# Patient Record
Sex: Female | Born: 1975 | Race: Black or African American | Hispanic: No | Marital: Married | Smoking: Never smoker
Health system: Southern US, Community
[De-identification: ages and names within clinical notes are randomized; demographics above are authoritative.]

## PROBLEM LIST (undated history)

## (undated) DIAGNOSIS — J45909 Unspecified asthma, uncomplicated: Secondary | ICD-10-CM

## (undated) HISTORY — PX: TUBAL LIGATION: SHX77

---

## 2016-02-25 ENCOUNTER — Emergency Department
Admission: EM | Admit: 2016-02-25 | Discharge: 2016-02-25 | Disposition: A | Discharge status: Home / Self Care | Payer: Managed Care, Other (non HMO) | Attending: Student in an Organized Health Care Education/Training Program | Admitting: Student in an Organized Health Care Education/Training Program

## 2016-02-25 ENCOUNTER — Emergency Department: Payer: Managed Care, Other (non HMO)

## 2016-02-25 DIAGNOSIS — Y929 Unspecified place or not applicable: Secondary | ICD-10-CM | POA: Insufficient documentation

## 2016-02-25 DIAGNOSIS — Y998 Other external cause status: Secondary | ICD-10-CM | POA: Diagnosis not present

## 2016-02-25 DIAGNOSIS — S99911A Unspecified injury of right ankle, initial encounter: Secondary | ICD-10-CM | POA: Diagnosis present

## 2016-02-25 DIAGNOSIS — Y9351 Activity, roller skating (inline) and skateboarding: Secondary | ICD-10-CM | POA: Diagnosis not present

## 2016-02-25 DIAGNOSIS — S82851A Displaced trimalleolar fracture of right lower leg, initial encounter for closed fracture: Secondary | ICD-10-CM

## 2016-02-25 DIAGNOSIS — J45909 Unspecified asthma, uncomplicated: Secondary | ICD-10-CM | POA: Diagnosis not present

## 2016-02-25 MED ORDER — PROPOFOL 1000 MG/100ML IV EMUL
INTRAVENOUS | Status: AC
  Administered 2016-02-25: 60 mg via INTRAVENOUS
  Filled 2016-02-25: qty 100

## 2016-02-25 MED ORDER — POLYETHYLENE GLYCOL 3350 17 G PO PACK: 17.0000 g | PACK | Freq: Every day | ORAL | 0 refills | Status: AC

## 2016-02-25 MED ORDER — KETAMINE HCL 10 MG/ML IJ SOLN
0.2000 mg/kg | Freq: Once | INTRAMUSCULAR | Status: AC
  Administered 2016-02-25: 25 mg via INTRAVENOUS
  Filled 2016-02-25: qty 1

## 2016-02-25 MED ORDER — LORAZEPAM 2 MG/ML IJ SOLN
INTRAMUSCULAR | Status: AC
  Filled 2016-02-25: qty 1

## 2016-02-25 MED ORDER — PROPOFOL 10 MG/ML IV BOLUS
0.5000 mg/kg | Freq: Once | INTRAVENOUS | Status: AC
  Administered 2016-02-25: 60 mg via INTRAVENOUS

## 2016-02-25 MED ORDER — KETAMINE HCL 10 MG/ML IJ SOLN
INTRAMUSCULAR | Status: AC | PRN
  Administered 2016-02-25: 40 mg via INTRAVENOUS

## 2016-02-25 MED ORDER — KETAMINE HCL 10 MG/ML IJ SOLN
1.0000 mg/kg | Freq: Once | INTRAMUSCULAR | Status: AC
  Administered 2016-02-25: 123 mg via INTRAVENOUS

## 2016-02-25 MED ORDER — FENTANYL CITRATE (PF) 100 MCG/2ML IJ SOLN
100.0000 ug | Freq: Once | INTRAMUSCULAR | Status: AC
  Administered 2016-02-25: 100 ug via INTRAVENOUS
  Filled 2016-02-25: qty 2

## 2016-02-25 MED ORDER — LORAZEPAM 2 MG/ML IJ SOLN
INTRAMUSCULAR | Status: AC | PRN
  Administered 2016-02-25: 0.5 mg via INTRAVENOUS

## 2016-02-25 MED ORDER — PROPOFOL 10 MG/ML IV BOLUS
INTRAVENOUS | Status: AC | PRN
  Administered 2016-02-25: 50 mg via INTRAVENOUS
  Administered 2016-02-25: 60 mg via INTRAVENOUS

## 2016-02-25 MED ORDER — HYDROCODONE-ACETAMINOPHEN 5-325 MG PO TABS: 1.0000 | ORAL_TABLET | ORAL | 0 refills | Status: AC | PRN

## 2016-02-25 MED ORDER — ASPIRIN EC 325 MG PO TBEC: 325.0000 mg | DELAYED_RELEASE_TABLET | Freq: Every day | ORAL | 0 refills | Status: AC

## 2016-02-25 NOTE — ED Triage Notes (Signed)
Pt came to ED via EMS c/o right ankle pain after fall while rollerskating. Given fentanyl by EMS.

## 2016-02-25 NOTE — ED Provider Notes (Signed)
[facility] Emergency Department Provider Note    First MD Initiated Contact with Patient 02/25/16 1553     (approximate)  I have reviewed the triage vital signs and the nursing notes.   HISTORY  Chief Complaint Ankle Pain    HPI Joanne Davis is a 40 y.o. female  who presents with acute right ankle pain after fall while roller skating. Patient with obvious deformity to right ankle. Denies any head injury. No chest, abdominal or pelvic pain. Denies any knee or hip pain. Is not on any blood thinners. EMS brought patient in the ER due to obvious deformity after splinting it in place. She does still have distal citrate sensation to her toes.  Currently rates the pain as 10 out of 10 in severity.   History reviewed. No pertinent past medical history. Family H/o no bleeding disorders Past Surgical History:  Procedure Laterality Date  . TUBAL LIGATION     There are no active problems to display for this patient.     Prior to Admission medications   Medication Sig Start Date End Date Taking? Authorizing Provider  aspirin EC 325 MG tablet Take 1 tablet (325 mg total) by mouth daily. 02/25/16 03/10/16  Willy Eddy, MD  HYDROcodone-acetaminophen (NORCO) 5-325 MG tablet Take 1 tablet by mouth every 4 (four) hours as needed for moderate pain. 02/25/16   Willy Eddy, MD  polyethylene glycol [facility] / Ethelene Hal) packet Take 17 g by mouth daily. Mix one tablespoon with 8oz of your favorite juice or water every day until you are having soft formed stools. Then start taking once daily if you didn't have a stool the day before. 02/25/16   Willy Eddy, MD    Allergies Patient has no known allergies.    Social History Social History  Substance Use Topics  . Smoking status: Never Smoker  . Smokeless tobacco: Never Used  . Alcohol use Yes     Comment: ocassional     Review of Systems Patient denies headaches, rhinorrhea, blurry vision, numbness,  shortness of breath, chest pain, edema, cough, abdominal pain, nausea, vomiting, diarrhea, dysuria, fevers, rashes or hallucinations unless otherwise stated above in HPI. ____________________________________________   PHYSICAL EXAM:  VITAL SIGNS: Vitals:   02/25/16 1830 02/25/16 1900  BP: 119/63   Pulse: 82 92  Resp: 16 18  Temp:     Constitutional: Alert and oriented.uncomfortable appearing but in no acute distress. Eyes: Conjunctivae are normal. PERRL. EOMI. Head: Atraumatic. Nose: No congestion/rhinnorhea. Mouth/Throat: Mucous membranes are moist.  Oropharynx non-erythematous. Neck: No stridor. Painless ROM. No cervical spine tenderness to palpation Hematological/Lymphatic/Immunilogical: No cervical lymphadenopathy. Cardiovascular: Normal rate, regular rhythm. Grossly normal heart sounds.  Good peripheral circulation. Respiratory: Normal respiratory effort.  No retractions. Lungs CTAB. Gastrointestinal: Soft and nontender. No distention. No abdominal bruits. No CVA tenderness.  Musculoskeletal:  Obvious varus deformity to right ankle,  SILT distally.  2+ DP and PT pulses.  No lacerations Neurologic:  Normal speech and language. No gross focal neurologic deficits are appreciated. No gait instability. Skin:  Skin is warm, dry and intact. No rash noted. Psychiatric: Mood and affect are normal. Speech and behavior are normal.  ____________________________________________   LABS (all labs ordered are listed, but only abnormal results are displayed)  No results found for this or any previous visit (from the past 24 hour(s)). ____________________________________________  EKG____________________________________________  RADIOLOGY  I personally reviewed all radiographic images ordered to evaluate for the above acute complaints and reviewed radiology reports and  findings.  These findings were personally discussed with the patient.  Please see medical record for radiology  report.  ____________________________________________   PROCEDURES  Procedure(s) performed:  ORTHOPEDIC INJURY TREATMENT Date/Time: 02/25/2016 7:36 PM Performed by: Willy EddyOBINSON, Phillippa Straub Authorized by: Willy EddyOBINSON, Mahreen Schewe  Consent: Verbal consent obtained. Risks and benefits: risks, benefits and alternatives were discussed Consent given by: patient Injury location: ankle Location details: right ankle Injury type: fracture-dislocation Fracture type: trimalleolar Pre-procedure neurovascular assessment: neurovascularly intact Pre-procedure distal perfusion: normal Pre-procedure neurological function: normal Pre-procedure range of motion: reduced  Sedation: Patient sedated: yes Sedation type: moderate (conscious) sedation Sedatives: ketamine, propofol and see MAR for details Analgesia: fentanyl, ketamine and see MAR for details Vitals: Vital signs were monitored during sedation. Manipulation performed: yes Skin traction used: yes Reduction successful: yes X-ray confirmed reduction: yes Immobilization: splint Splint type: short leg Supplies used: Ortho-Glass Post-procedure neurovascular assessment: post-procedure neurovascularly intact Post-procedure distal perfusion: normal Post-procedure neurological function: normal Post-procedure range of motion: unchanged Patient tolerance: Patient tolerated the procedure well with no immediate complications       Critical Care performed: no ____________________________________________   INITIAL IMPRESSION / ASSESSMENT AND PLAN / ED COURSE  Pertinent labs & imaging results that were available during my care of the patient were reviewed by me and considered in my medical decision making (see chart for details).  DDX: fracture, dislocation, contusion, sprain  Joanne Davis is a 40 y.o.  female with acute right ankle injury. Patient is AFVSS in ED. Exam as above. Given current presentation have considered the above  differential. Denies any other injuries. Denies motor or sensory loss. Obvious deformity concerning for fracture with dislocation.. Afebrile and VSS in Ed. Exam as above. NV intact throughout and distal to injury. Treatments will include analgesia, observation, X-rays.   Clinical Course as of Feb 25 1936  Sat Feb 25, 2016  1638 Patient with no lateral knee pain. X-ray shows evidence of a trimalleolar fracture of the right ankle. Plan sedation with reduction.  [PR]  1748 Ankle reduction performed under procedural sedation with resolved deformity.  Post reduction xr ordered.  [PR]  1823 Dr. Rosita KeaMenz of orthopedics has reviewed the x-ray imaging and does state that the reduction is appropriate for close outpatient follow-up. He agrees to see patient in clinic on Tuesday at 9 AM.  [PR]  1935 Patient now stable after her sedation. Discussed the importance of elevation as well as aspirin for DVT prophylaxis. Discussed signs and symptoms for which patient should return to the ER. Patient is able to ambulate with crutches.    [PR]    Clinical Course User Index [PR] Willy EddyPatrick Sadat Sliwa, MD     ____________________________________________   FINAL CLINICAL IMPRESSION(S) / ED DIAGNOSES  Final diagnoses:  Closed trimalleolar fracture of right ankle, initial encounter      NEW MEDICATIONS STARTED DURING THIS VISIT:  New Prescriptions   ASPIRIN EC 325 MG TABLET    Take 1 tablet (325 mg total) by mouth daily.   HYDROCODONE-ACETAMINOPHEN (NORCO) 5-325 MG TABLET    Take 1 tablet by mouth every 4 (four) hours as needed for moderate pain.   POLYETHYLENE GLYCOL (MIRALAX / GLYCOLAX) PACKET    Take 17 g by mouth daily. Mix one tablespoon with 8oz of your favorite juice or water every day until you are having soft formed stools. Then start taking once daily if you didn't have a stool the day before.     Note:  This document was prepared using Conservation officer, historic buildingsDragon voice recognition software  and may include unintentional  dictation errors.    Willy EddyPatrick Mervyn Pflaum, MD 02/25/16 [id number]

## 2016-02-25 NOTE — ED Notes (Signed)
Pt assisted up to restroom. Pt fitted with walker.

## 2016-02-25 NOTE — Discharge Instructions (Signed)
Keep splint on until seen by your physician or the orthopedic physician As directed. Elevate for swelling and pain. Keep splint material dry. Take medications as needed for pain. Return for severe intractable pain or other concerns. ° °

## 2016-02-29 ENCOUNTER — Other Ambulatory Visit: Payer: Managed Care, Other (non HMO)

## 2016-02-29 ENCOUNTER — Encounter
Admission: RE | Admit: 2016-02-29 | Discharge: 2016-02-29 | Disposition: A | Discharge status: Home / Self Care | Payer: Managed Care, Other (non HMO) | Source: Ambulatory Visit | Attending: Orthopedic Surgery | Admitting: Orthopedic Surgery

## 2016-02-29 HISTORY — DX: Unspecified asthma, uncomplicated: J45.909

## 2016-02-29 NOTE — Patient Instructions (Signed)
Your procedure is scheduled on: 03/01/16 Report to DAY SURGERY. 2ND FLOOR MEDICAL MALL ENTRANCE. To find out your arrival time please call [phone] between 1PM - 3PM on 02/29/16.  Remember: Instructions that are not followed completely may result in serious medical risk, up to and including death, or upon the discretion of your surgeon and anesthesiologist your surgery may need to be rescheduled.    __X__ 1. Do not eat food or drink liquids after midnight. No gum chewing or hard candies.     __X__ 2. No Alcohol for 24 hours before or after surgery.   ____ 3. Bring all medications with you on the day of surgery if instructed.    __X__ 4. Notify your doctor if there is any change in your medical condition     (cold, fever, infections).             ___x__5. No smoking within 24 hours of your surgery.     Do not wear jewelry, make-up, hairpins, clips or nail polish.  Do not wear lotions, powders, or perfumes.   Do not shave 48 hours prior to surgery. Men may shave face and neck.  Do not bring valuables to the hospital.    [facility] any belongings or valuables.               Contacts, dentures or bridgework may not be worn into surgery.  Leave your suitcase in the car. After surgery it may be brought to your room.  For patients admitted to the hospital, discharge time is determined by your                treatment team.   Patients discharged the day of surgery will not be allowed to drive home.   Please read over the following fact sheets that you were given:    ____ Take these medicines the morning of surgery with A SIP OF WATER:    1. Hydrocodone if needed  2.   3.   4.  5.  6.  ____ Fleet Enema (as directed)   ____ Use CHG Soap as directed  ____ Use inhalers on the day of surgery  ____ Stop metformin 2 days prior to surgery    ____ Take 1/2 of usual insulin dose the night before surgery and none on the morning of surgery.   __x__ Stop  Coumadin/Plavix/aspirin on Now  __X__ Stop Anti-inflammatories such as Advil, Aleve, Ibuprofen, Motrin, Naproxen, Naprosyn, Goodies,powder, or aspirin products.  OK to take Tylenol.   ____ Stop supplements until after surgery.    ____ Bring C-Pap to the hospital.

## 2016-03-01 ENCOUNTER — Ambulatory Visit
Admission: RE | Admit: 2016-03-01 | Discharge: 2016-03-01 | Disposition: A | Discharge status: Home / Self Care | Payer: Managed Care, Other (non HMO) | Source: Ambulatory Visit | Attending: Orthopedic Surgery | Admitting: Orthopedic Surgery

## 2016-03-01 ENCOUNTER — Ambulatory Visit: Payer: Managed Care, Other (non HMO)

## 2016-03-01 ENCOUNTER — Ambulatory Visit: Payer: Managed Care, Other (non HMO) | Admitting: Anesthesiology

## 2016-03-01 ENCOUNTER — Encounter: Payer: Self-pay | Admitting: *Deleted

## 2016-03-01 ENCOUNTER — Encounter
Admission: RE | Disposition: A | Discharge status: Home / Self Care | Payer: Self-pay | Source: Ambulatory Visit | Attending: Orthopedic Surgery

## 2016-03-01 DIAGNOSIS — Z6841 Body Mass Index (BMI) 40.0 and over, adult: Secondary | ICD-10-CM | POA: Diagnosis not present

## 2016-03-01 DIAGNOSIS — Y998 Other external cause status: Secondary | ICD-10-CM | POA: Diagnosis not present

## 2016-03-01 DIAGNOSIS — J45909 Unspecified asthma, uncomplicated: Secondary | ICD-10-CM | POA: Insufficient documentation

## 2016-03-01 DIAGNOSIS — Z419 Encounter for procedure for purposes other than remedying health state, unspecified: Secondary | ICD-10-CM

## 2016-03-01 DIAGNOSIS — W010XXA Fall on same level from slipping, tripping and stumbling without subsequent striking against object, initial encounter: Secondary | ICD-10-CM | POA: Diagnosis not present

## 2016-03-01 DIAGNOSIS — Y92331 Roller skating rink as the place of occurrence of the external cause: Secondary | ICD-10-CM | POA: Insufficient documentation

## 2016-03-01 DIAGNOSIS — Y9351 Activity, roller skating (inline) and skateboarding: Secondary | ICD-10-CM | POA: Insufficient documentation

## 2016-03-01 DIAGNOSIS — S82851A Displaced trimalleolar fracture of right lower leg, initial encounter for closed fracture: Secondary | ICD-10-CM | POA: Diagnosis not present

## 2016-03-01 DIAGNOSIS — Z8781 Personal history of (healed) traumatic fracture: Secondary | ICD-10-CM

## 2016-03-01 DIAGNOSIS — Z9889 Other specified postprocedural states: Secondary | ICD-10-CM

## 2016-03-01 HISTORY — PX: ORIF ANKLE FRACTURE: SHX5408

## 2016-03-01 LAB — BASIC METABOLIC PANEL
ANION GAP: 6 (ref 5–15)
BUN: 9 mg/dL (ref 6–20)
CALCIUM: 8.5 mg/dL — AB (ref 8.9–10.3)
CO2: 24 mmol/L (ref 22–32)
CREATININE: 0.79 mg/dL (ref 0.44–1.00)
Chloride: 108 mmol/L (ref 101–111)
GFR calc Af Amer: >60 mL/min (ref >60)
GLUCOSE: 89 mg/dL (ref 65–99)
Potassium: 4 mmol/L (ref 3.5–5.1)
Sodium: 138 mmol/L (ref 135–145)

## 2016-03-01 SURGERY — OPEN REDUCTION INTERNAL FIXATION (ORIF) ANKLE FRACTURE
Anesthesia: General | Site: Ankle | Laterality: Right | Wound class: Clean

## 2016-03-01 MED ORDER — ONDANSETRON HCL 4 MG PO TABS: 4.0000 mg | ORAL_TABLET | Freq: Four times a day (QID) | ORAL | Status: DC | PRN

## 2016-03-01 MED ORDER — ROCURONIUM BROMIDE 50 MG/5ML IV SOSY
PREFILLED_SYRINGE | INTRAVENOUS | Status: AC
  Filled 2016-03-01: qty 5

## 2016-03-01 MED ORDER — CEFAZOLIN SODIUM 1 G IJ SOLR
INTRAMUSCULAR | Status: AC
  Filled 2016-03-01: qty 10

## 2016-03-01 MED ORDER — KETOROLAC TROMETHAMINE 30 MG/ML IJ SOLN
INTRAMUSCULAR | Status: AC
  Filled 2016-03-01: qty 1

## 2016-03-01 MED ORDER — HYDROCODONE-ACETAMINOPHEN 5-325 MG PO TABS
1.0000 | ORAL_TABLET | ORAL | Status: DC | PRN
  Administered 2016-03-01: 1 via ORAL

## 2016-03-01 MED ORDER — METOCLOPRAMIDE HCL 10 MG PO TABS: 5.0000 mg | ORAL_TABLET | Freq: Three times a day (TID) | ORAL | Status: DC | PRN

## 2016-03-01 MED ORDER — METHOCARBAMOL 500 MG PO TABS: 500.0000 mg | ORAL_TABLET | Freq: Four times a day (QID) | ORAL | Status: DC | PRN

## 2016-03-01 MED ORDER — MIDAZOLAM HCL 2 MG/2ML IJ SOLN
INTRAMUSCULAR | Status: AC
  Filled 2016-03-01: qty 2

## 2016-03-01 MED ORDER — HYDROMORPHONE HCL 1 MG/ML IJ SOLN
INTRAMUSCULAR | Status: AC
  Filled 2016-03-01: qty 1

## 2016-03-01 MED ORDER — ACETAMINOPHEN 10 MG/ML IV SOLN
INTRAVENOUS | Status: AC
  Filled 2016-03-01: qty 100

## 2016-03-01 MED ORDER — ONDANSETRON HCL 4 MG/2ML IJ SOLN
4.0000 mg | Freq: Four times a day (QID) | INTRAMUSCULAR | Status: DC | PRN
  Administered 2016-03-01: 4 mg via INTRAVENOUS

## 2016-03-01 MED ORDER — DOCUSATE SODIUM 100 MG PO CAPS: 100.0000 mg | ORAL_CAPSULE | Freq: Two times a day (BID) | ORAL | Status: DC

## 2016-03-01 MED ORDER — FENTANYL CITRATE (PF) 250 MCG/5ML IJ SOLN
INTRAMUSCULAR | Status: AC
  Filled 2016-03-01: qty 5

## 2016-03-01 MED ORDER — CEFAZOLIN SODIUM-DEXTROSE 2-4 GM/100ML-% IV SOLN: 2.0000 g | Freq: Four times a day (QID) | INTRAVENOUS | Status: DC

## 2016-03-01 MED ORDER — SODIUM CHLORIDE 0.9 % IV SOLN
INTRAVENOUS | Status: DC
  Administered 2016-03-01: 10 mL via INTRAVENOUS

## 2016-03-01 MED ORDER — PROPOFOL 10 MG/ML IV BOLUS
INTRAVENOUS | Status: DC | PRN
  Administered 2016-03-01: 200 mg via INTRAVENOUS

## 2016-03-01 MED ORDER — FAMOTIDINE 20 MG PO TABS
20.0000 mg | ORAL_TABLET | Freq: Once | ORAL | Status: AC
  Administered 2016-03-01: 20 mg via ORAL

## 2016-03-01 MED ORDER — ACETAMINOPHEN 10 MG/ML IV SOLN
INTRAVENOUS | Status: DC | PRN
  Administered 2016-03-01: 1000 mg via INTRAVENOUS

## 2016-03-01 MED ORDER — FAMOTIDINE 20 MG PO TABS
ORAL_TABLET | ORAL | Status: AC
  Administered 2016-03-01: 20 mg via ORAL
  Filled 2016-03-01: qty 1

## 2016-03-01 MED ORDER — MAGNESIUM CITRATE PO SOLN: 1.0000 | Freq: Once | ORAL | Status: DC | PRN

## 2016-03-01 MED ORDER — BISACODYL 5 MG PO TBEC: 5.0000 mg | DELAYED_RELEASE_TABLET | Freq: Every day | ORAL | Status: DC | PRN

## 2016-03-01 MED ORDER — SUCCINYLCHOLINE CHLORIDE 200 MG/10ML IV SOSY
PREFILLED_SYRINGE | INTRAVENOUS | Status: AC
  Filled 2016-03-01: qty 10

## 2016-03-01 MED ORDER — MORPHINE SULFATE (PF) 4 MG/ML IV SOLN: 2.0000 mg | INTRAVENOUS | Status: DC | PRN

## 2016-03-01 MED ORDER — FENTANYL CITRATE (PF) 100 MCG/2ML IJ SOLN
INTRAMUSCULAR | Status: AC
  Filled 2016-03-01: qty 2

## 2016-03-01 MED ORDER — DIPHENHYDRAMINE HCL 12.5 MG/5ML PO ELIX
12.5000 mg | ORAL_SOLUTION | ORAL | Status: DC | PRN
  Filled 2016-03-01: qty 10

## 2016-03-01 MED ORDER — ACETAMINOPHEN 650 MG RE SUPP: 650.0000 mg | Freq: Four times a day (QID) | RECTAL | Status: DC | PRN

## 2016-03-01 MED ORDER — EPHEDRINE 5 MG/ML INJ
INTRAVENOUS | Status: AC
  Filled 2016-03-01: qty 10

## 2016-03-01 MED ORDER — FENTANYL CITRATE (PF) 100 MCG/2ML IJ SOLN
25.0000 ug | INTRAMUSCULAR | Status: DC | PRN
  Administered 2016-03-01 (×3): 50 ug via INTRAVENOUS

## 2016-03-01 MED ORDER — LIDOCAINE 2% (20 MG/ML) 5 ML SYRINGE
INTRAMUSCULAR | Status: AC
  Filled 2016-03-01: qty 5

## 2016-03-01 MED ORDER — SUGAMMADEX SODIUM 500 MG/5ML IV SOLN
INTRAVENOUS | Status: AC
  Filled 2016-03-01: qty 5

## 2016-03-01 MED ORDER — MIDAZOLAM HCL 2 MG/2ML IJ SOLN
INTRAMUSCULAR | Status: DC | PRN
  Administered 2016-03-01: 2 mg via INTRAVENOUS

## 2016-03-01 MED ORDER — ACETAMINOPHEN 325 MG PO TABS: 650.0000 mg | ORAL_TABLET | Freq: Four times a day (QID) | ORAL | Status: DC | PRN

## 2016-03-01 MED ORDER — DEXTROSE 5 % IV SOLN
3.0000 g | Freq: Once | INTRAVENOUS | Status: AC
  Administered 2016-03-01: 3 g via INTRAVENOUS
  Filled 2016-03-01: qty 3000

## 2016-03-01 MED ORDER — NEOMYCIN-POLYMYXIN B GU 40-200000 IR SOLN
Status: DC | PRN
  Administered 2016-03-01: 4 mL

## 2016-03-01 MED ORDER — FENTANYL CITRATE (PF) 100 MCG/2ML IJ SOLN
INTRAMUSCULAR | Status: DC | PRN
  Administered 2016-03-01: 50 ug via INTRAVENOUS
  Administered 2016-03-01: 100 ug via INTRAVENOUS
  Administered 2016-03-01 (×2): 50 ug via INTRAVENOUS

## 2016-03-01 MED ORDER — PROPOFOL 10 MG/ML IV BOLUS
INTRAVENOUS | Status: AC
  Filled 2016-03-01: qty 20

## 2016-03-01 MED ORDER — ONDANSETRON HCL 4 MG/2ML IJ SOLN
INTRAMUSCULAR | Status: AC
Start: 2016-03-01 — End: 2016-03-01
  Administered 2016-03-01: 4 mg via INTRAVENOUS
  Filled 2016-03-01: qty 2

## 2016-03-01 MED ORDER — LACTATED RINGERS IV SOLN
INTRAVENOUS | Status: DC
  Administered 2016-03-01: 75 mL/h via INTRAVENOUS
  Administered 2016-03-01: 11:00:00 via INTRAVENOUS

## 2016-03-01 MED ORDER — LIDOCAINE HCL (CARDIAC) 20 MG/ML IV SOLN
INTRAVENOUS | Status: DC | PRN
  Administered 2016-03-01: 100 mg via INTRAVENOUS

## 2016-03-01 MED ORDER — SODIUM CHLORIDE 0.9 % IJ SOLN
INTRAMUSCULAR | Status: AC
  Administered 2016-03-01: 10 mL via INTRAVENOUS
  Filled 2016-03-01: qty 20

## 2016-03-01 MED ORDER — MAGNESIUM HYDROXIDE 400 MG/5ML PO SUSP: 30.0000 mL | Freq: Every day | ORAL | Status: DC | PRN

## 2016-03-01 MED ORDER — CEFAZOLIN SODIUM-DEXTROSE 2-4 GM/100ML-% IV SOLN
INTRAVENOUS | Status: AC
  Filled 2016-03-01: qty 100

## 2016-03-01 MED ORDER — METHOCARBAMOL 1000 MG/10ML IJ SOLN: 500.0000 mg | Freq: Four times a day (QID) | INTRAVENOUS | Status: DC | PRN

## 2016-03-01 MED ORDER — SUGAMMADEX SODIUM 500 MG/5ML IV SOLN
INTRAVENOUS | Status: DC | PRN
  Administered 2016-03-01: 245 mg via INTRAVENOUS

## 2016-03-01 MED ORDER — ZOLPIDEM TARTRATE 5 MG PO TABS: 5.0000 mg | ORAL_TABLET | Freq: Every evening | ORAL | Status: DC | PRN

## 2016-03-01 MED ORDER — HYDROMORPHONE HCL 1 MG/ML IJ SOLN
0.5000 mg | INTRAMUSCULAR | Status: AC | PRN
  Administered 2016-03-01 (×4): 0.5 mg via INTRAVENOUS

## 2016-03-01 MED ORDER — PROMETHAZINE HCL 25 MG/ML IJ SOLN: 6.2500 mg | INTRAMUSCULAR | Status: DC | PRN

## 2016-03-01 MED ORDER — HYDROCODONE-ACETAMINOPHEN 5-325 MG PO TABS
ORAL_TABLET | ORAL | Status: AC
  Filled 2016-03-01: qty 1

## 2016-03-01 MED ORDER — METOCLOPRAMIDE HCL 5 MG/ML IJ SOLN: 5.0000 mg | Freq: Three times a day (TID) | INTRAMUSCULAR | Status: DC | PRN

## 2016-03-01 MED ORDER — ENOXAPARIN SODIUM 40 MG/0.4ML ~~LOC~~ SOLN: 40.0000 mg | SUBCUTANEOUS | Status: DC

## 2016-03-01 MED ORDER — DEXAMETHASONE SODIUM PHOSPHATE 10 MG/ML IJ SOLN
INTRAMUSCULAR | Status: DC | PRN
  Administered 2016-03-01: 10 mg via INTRAVENOUS

## 2016-03-01 MED ORDER — SUCCINYLCHOLINE CHLORIDE 20 MG/ML IJ SOLN
INTRAMUSCULAR | Status: DC | PRN
  Administered 2016-03-01: 120 mg via INTRAVENOUS

## 2016-03-01 MED ORDER — ROCURONIUM BROMIDE 100 MG/10ML IV SOLN
INTRAVENOUS | Status: DC | PRN
  Administered 2016-03-01: 40 mg via INTRAVENOUS
  Administered 2016-03-01: 20 mg via INTRAVENOUS
  Administered 2016-03-01 (×2): 10 mg via INTRAVENOUS

## 2016-03-01 MED ORDER — ONDANSETRON HCL 4 MG/2ML IJ SOLN
INTRAMUSCULAR | Status: DC | PRN
  Administered 2016-03-01: 4 mg via INTRAVENOUS

## 2016-03-01 SURGICAL SUPPLY — 64 items
BANDAGE ACE 4X5 VEL STRL LF (GAUZE/BANDAGES/DRESSINGS) ×6 IMPLANT
BIT DRILL 2.5X2.75 QC CALB (BIT) ×3 IMPLANT
BLADE SURG SZ10 CARB STEEL (BLADE) ×6 IMPLANT
BNDG COHESIVE 6X5 TAN STRL LF (GAUZE/BANDAGES/DRESSINGS) ×3 IMPLANT
BNDG ESMARK 4X12 TAN STRL LF (GAUZE/BANDAGES/DRESSINGS) ×3 IMPLANT
CANISTER SUCT 1200ML W/VALVE (MISCELLANEOUS) ×3 IMPLANT
CHLORAPREP W/TINT 26ML (MISCELLANEOUS) ×3 IMPLANT
CUFF TOURN 24 STER (MISCELLANEOUS) IMPLANT
CUFF TOURN 30 STER DUAL PORT (MISCELLANEOUS) IMPLANT
DRAPE C-ARM 42X72 X-RAY (DRAPES) ×3 IMPLANT
DRAPE FLUOR MINI C-ARM 54X84 (DRAPES) ×3 IMPLANT
DRAPE INCISE IOBAN 66X45 STRL (DRAPES) ×3 IMPLANT
DRAPE U-SHAPE 47X51 STRL (DRAPES) ×3 IMPLANT
DRSG EMULSION OIL 3X8 NADH (GAUZE/BANDAGES/DRESSINGS) ×3 IMPLANT
ELECT CAUTERY BLADE 6.4 (BLADE) ×3 IMPLANT
ELECT REM PT RETURN 9FT ADLT (ELECTROSURGICAL) ×3
ELECTRODE REM PT RTRN 9FT ADLT (ELECTROSURGICAL) ×1 IMPLANT
GAUZE PETRO XEROFOAM 1X8 (MISCELLANEOUS) ×3 IMPLANT
GAUZE SPONGE 4X4 12PLY STRL (GAUZE/BANDAGES/DRESSINGS) ×3 IMPLANT
GLOVE BIOGEL PI IND STRL 9 (GLOVE) ×1 IMPLANT
GLOVE BIOGEL PI INDICATOR 9 (GLOVE) ×2
GLOVE INDICATOR 7.5 STRL GRN (GLOVE) ×3 IMPLANT
GLOVE SURG SYN 9.0  PF PI (GLOVE) ×2
GLOVE SURG SYN 9.0 PF PI (GLOVE) ×1 IMPLANT
GOWN SRG 2XL LVL 4 RGLN SLV (GOWNS) ×1 IMPLANT
GOWN STRL NON-REIN 2XL LVL4 (GOWNS) ×2
GOWN STRL REUS W/ TWL LRG LVL3 (GOWN DISPOSABLE) ×1 IMPLANT
GOWN STRL REUS W/TWL LRG LVL3 (GOWN DISPOSABLE) ×2
HEMOVAC 400ML (MISCELLANEOUS) ×3
K-WIRE ACE 1.6X6 (WIRE) ×3
KIT DRAIN HEMOVAC JP 7FR 400ML (MISCELLANEOUS) ×1 IMPLANT
KIT RM TURNOVER STRD PROC AR (KITS) ×3 IMPLANT
KWIRE ACE 1.6X6 (WIRE) ×1 IMPLANT
LABEL OR SOLS (LABEL) ×3 IMPLANT
NS IRRIG 1000ML POUR BTL (IV SOLUTION) ×3 IMPLANT
PACK EXTREMITY ARMC (MISCELLANEOUS) ×3 IMPLANT
PAD ABD DERMACEA PRESS 5X9 (GAUZE/BANDAGES/DRESSINGS) ×6 IMPLANT
PAD CAST CTTN 4X4 STRL (SOFTGOODS) ×2 IMPLANT
PAD PREP 24X41 OB/GYN DISP (PERSONAL CARE ITEMS) ×3 IMPLANT
PADDING CAST COTTON 4X4 STRL (SOFTGOODS) ×4
PLATE ACE 100DEG 7HOLE (Plate) ×3 IMPLANT
PLATE ACE 3.5MM 4HOLE (Plate) ×3 IMPLANT
SCREW CORTICAL 3.5MM  16MM (Screw) ×6 IMPLANT
SCREW CORTICAL 3.5MM  30MM (Screw) ×2 IMPLANT
SCREW CORTICAL 3.5MM  34MM (Screw) ×4 IMPLANT
SCREW CORTICAL 3.5MM 14MM (Screw) ×3 IMPLANT
SCREW CORTICAL 3.5MM 16MM (Screw) ×3 IMPLANT
SCREW CORTICAL 3.5MM 18MM (Screw) ×6 IMPLANT
SCREW CORTICAL 3.5MM 26MM (Screw) ×3 IMPLANT
SCREW CORTICAL 3.5MM 30MM (Screw) ×1 IMPLANT
SCREW CORTICAL 3.5MM 34MM (Screw) ×2 IMPLANT
SPLINT CAST 1 STEP 5X30 WHT (MISCELLANEOUS) ×3 IMPLANT
SPONGE LAP 18X18 5 PK (GAUZE/BANDAGES/DRESSINGS) ×3 IMPLANT
STAPLER SKIN PROX 35W (STAPLE) ×3 IMPLANT
STOCKINETTE STRL 6IN 960660 (GAUZE/BANDAGES/DRESSINGS) ×3 IMPLANT
SUT ETHILON 3-0 FS-10 30 BLK (SUTURE) ×3
SUT MNCRL AB 4-0 PS2 18 (SUTURE) ×6 IMPLANT
SUT VIC AB 0 CT1 36 (SUTURE) ×3 IMPLANT
SUT VIC AB 2-0 SH 27 (SUTURE) ×4
SUT VIC AB 2-0 SH 27XBRD (SUTURE) ×2 IMPLANT
SUT VIC AB 3-0 SH 27 (SUTURE) ×2
SUT VIC AB 3-0 SH 27X BRD (SUTURE) ×1 IMPLANT
SUTURE EHLN 3-0 FS-10 30 BLK (SUTURE) ×1 IMPLANT
SYRINGE 10CC LL (SYRINGE) ×3 IMPLANT

## 2016-03-01 NOTE — Discharge Instructions (Signed)

## 2016-03-01 NOTE — Anesthesia Preprocedure Evaluation (Signed)
Anesthesia Evaluation  Patient identified by MRN, date of birth, ID band Patient awake    Reviewed: Allergy & Precautions, H&P , NPO status , Patient's Chart, lab work & pertinent test results, reviewed documented beta blocker date and time   History of Anesthesia Complications Negative for: history of anesthetic complications  Airway Mallampati: I  TM Distance: >3 FB Neck ROM: full    Dental  (+) Missing, Chipped, Teeth Intact, Dental Advidsory Given   Pulmonary neg shortness of breath, asthma , neg sleep apnea, neg recent URI,    Pulmonary exam normal breath sounds clear to auscultation       Cardiovascular Exercise Tolerance: Good negative cardio ROS Normal cardiovascular exam Rhythm:regular Rate:Normal     Neuro/Psych negative neurological ROS  negative psych ROS   GI/Hepatic negative GI ROS, Neg liver ROS,   Endo/Other  neg diabetesMorbid obesity  Renal/GU negative Renal ROS  negative genitourinary   Musculoskeletal   Abdominal   Peds  Hematology negative hematology ROS (+)   Anesthesia Other Findings Past Medical History: No date: Childhood asthma   Reproductive/Obstetrics negative OB ROS                             Anesthesia Physical Anesthesia Plan  ASA: III  Anesthesia Plan: General   Post-op Pain Management:    Induction:   Airway Management Planned:   Additional Equipment:   Intra-op Plan:   Post-operative Plan:   Informed Consent: I have reviewed the patients History and Physical, chart, labs and discussed the procedure including the risks, benefits and alternatives for the proposed anesthesia with the patient or authorized representative who has indicated his/her understanding and acceptance.   Dental Advisory Given  Plan Discussed with: Anesthesiologist, CRNA and Surgeon  Anesthesia Plan Comments:         Anesthesia Quick Evaluation

## 2016-03-01 NOTE — H&P (Signed)
Reviewed paper H+P, will be scanned into chart. No changes noted.  

## 2016-03-01 NOTE — Anesthesia Procedure Notes (Signed)
Procedure Name: Intubation Date/Time: 03/01/2016 12:16 PM Performed by: Junious SilkNOLES, Glenora Morocho Pre-anesthesia Checklist: Patient identified, Patient being monitored, Timeout performed, Emergency Drugs available and Suction available Patient Re-evaluated:Patient Re-evaluated prior to inductionOxygen Delivery Method: Circle system utilized Preoxygenation: Pre-oxygenation with 100% oxygen Intubation Type: IV induction Ventilation: Mask ventilation without difficulty Laryngoscope Size: Mac and 3 Grade View: Grade I Tube type: Oral Tube size: 7.0 mm Number of attempts: 1 Airway Equipment and Method: Stylet Placement Confirmation: ETT inserted through vocal cords under direct vision,  positive ETCO2 and breath sounds checked- equal and bilateral Secured at: 21 cm Tube secured with: Tape Dental Injury: Teeth and Oropharynx as per pre-operative assessment

## 2016-03-01 NOTE — Op Note (Signed)
03/01/2016  1:48 PM  PATIENT:  Joanne Davis  40 y.o. female  PRE-OPERATIVE DIAGNOSIS:  closed nondisplaced trimalleolar fracture of right ankle  POST-OPERATIVE DIAGNOSIS:  closed nondisplaced trimalleolar fracture of right ankle  PROCEDURE:  Procedure(s): OPEN REDUCTION INTERNAL FIXATION (ORIF) ANKLE FRACTURE (Right)  SURGEON: Leitha SchullerMichael J Warrick Llera, MD  ASSISTANTS: None  ANESTHESIA:   general  EBL:  Total I/O In: 800 [I.V.:800] Out: 10 [Blood:10]  BLOOD ADMINISTERED:none  DRAINS: none   LOCAL MEDICATIONS USED:  NONE  SPECIMEN:  No Specimen  DISPOSITION OF SPECIMEN:  N/A  COUNTS:  YES  TOURNIQUET:   96 minutes at 300 mmHg  IMPLANTS: Biomet small fragment system with two third tubular plates with multiple screws and one 4.0 cannulated screw  DICTATION: .Dragon Dictation patient brought the operating room and after adequate anesthesia was obtained the patient was placed prone and the right leg was prepped and draped in sterile fashion with a tourniquet by the upper thigh. After patient identification and timeout procedure posterior lateral approach was made to the ankle incision between the peroneal tendons and the Achilles tendon. No tendon sheath was opened and the tendons retracted medially exposing the posterior aspect of the fibula. The fracture site was exposed and a reduction clamp used to get the 2 main fragments and anatomic alignment. A 7 hole third tubular plate was then applied to the posterior aspect of the fibula with 3 screws proximal and distal this gave rigid fixation with essentially anatomic alignment there was a small butterfly fragment which was attached to the  Peroneal muscles which was left alone. Next the FHL was elevated after retracting the peroneal tendons laterally and this gave exposure of the posterior aspect of the distal tibia following this off for hole third tubular plate was placed and this was used as a buttress with 2 screws placed in the end to  pull the plate down against the bone and then the more proximal this to screw holes at the level of the fracture and placed through the plate and this helped get near anatomic alignment of this posterior malleolar fragment. With stress views there is no subluxation possible after fixing this plate with the fibula also fixed. Next the medial malleolus was fixed with a anteromedial incision the fracture was nearly anatomically reduced with a prior reduction of the lateral and posterior malleoli a guide K wire was sent in a appropriate position and then a 34 mm cannulated screw was placed over the wire getting good compression at the fracture site think wire was removed and after thorough evaluation a is deemed acceptable alignment of the metal the wounds were thoroughly irrigated and the wounds closed with 3-0 Vicryl first 2) tendon sheath and the subcutaneous tissue and then skin staples followed by Xeroform 4 x 4's web roll and a stirrup splint followed by Ace wrap's the patient was then sent to recovery in stable condition  PLAN OF CARE: Admit for overnight observation  PATIENT DISPOSITION:  PACU - hemodynamically stable.

## 2016-03-01 NOTE — Transfer of Care (Signed)
Immediate Anesthesia Transfer of Care Note  Patient: Joanne Davis  Procedure(s) Performed: Procedure(s): OPEN REDUCTION INTERNAL FIXATION (ORIF) ANKLE FRACTURE (Right)  Patient Location: PACU  Anesthesia Type:General  Level of Consciousness: sedated  Airway & Oxygen Therapy: Patient Spontanous Breathing and Patient connected to face mask oxygen  Post-op Assessment: Report given to RN and Post -op Vital signs reviewed and stable  Post vital signs: Reviewed and stable  Last Vitals:  Vitals:   03/01/16 1040 03/01/16 1344  BP: 140/64 (P) 123/61  Pulse: 76   Resp: 20   Temp: (!) 36.1 C (P) 36.4 C    Last Pain:  Vitals:   03/01/16 1040  TempSrc: Tympanic  PainSc: 0-No pain      Patients Stated Pain Goal: 0 (03/01/16 1040)  Complications: No apparent anesthesia complications

## 2016-03-02 ENCOUNTER — Encounter: Payer: Self-pay | Admitting: Orthopedic Surgery

## 2016-03-06 ENCOUNTER — Encounter: Payer: Self-pay | Admitting: Orthopedic Surgery

## 2016-03-06 NOTE — Anesthesia Postprocedure Evaluation (Signed)
Anesthesia Post Note  Patient: Location managerChevon Hauss  Procedure(s) Performed: Procedure(s) (LRB): OPEN REDUCTION INTERNAL FIXATION (ORIF) ANKLE FRACTURE (Right)  Patient location during evaluation: PACU Anesthesia Type: General Level of consciousness: awake and alert Pain management: pain level controlled Vital Signs Assessment: post-procedure vital signs reviewed and stable Respiratory status: spontaneous breathing, nonlabored ventilation, respiratory function stable and patient connected to nasal cannula oxygen Cardiovascular status: blood pressure returned to baseline and stable Postop Assessment: no signs of nausea or vomiting Anesthetic complications: no     Last Vitals:  Vitals:   03/01/16 1515 03/01/16 1530  BP: (!) 148/87 (!) 152/82  Pulse: 63 75  Resp: 16   Temp:      Last Pain:  Vitals:   03/02/16 0816  TempSrc:   PainSc: 5                  Lenard SimmerAndrew Shameika Speelman

## 2018-05-26 IMAGING — DX DG ANKLE COMPLETE 3+V*R*
2 series · 2 of 2 positions shown · non-contrast
Comparison: None.

CLINICAL DATA: Fall while skating with ankle deformity, initial
encounter

EXAM:
RIGHT ANKLE - COMPLETE 3+ VIEW

[ankle ap]
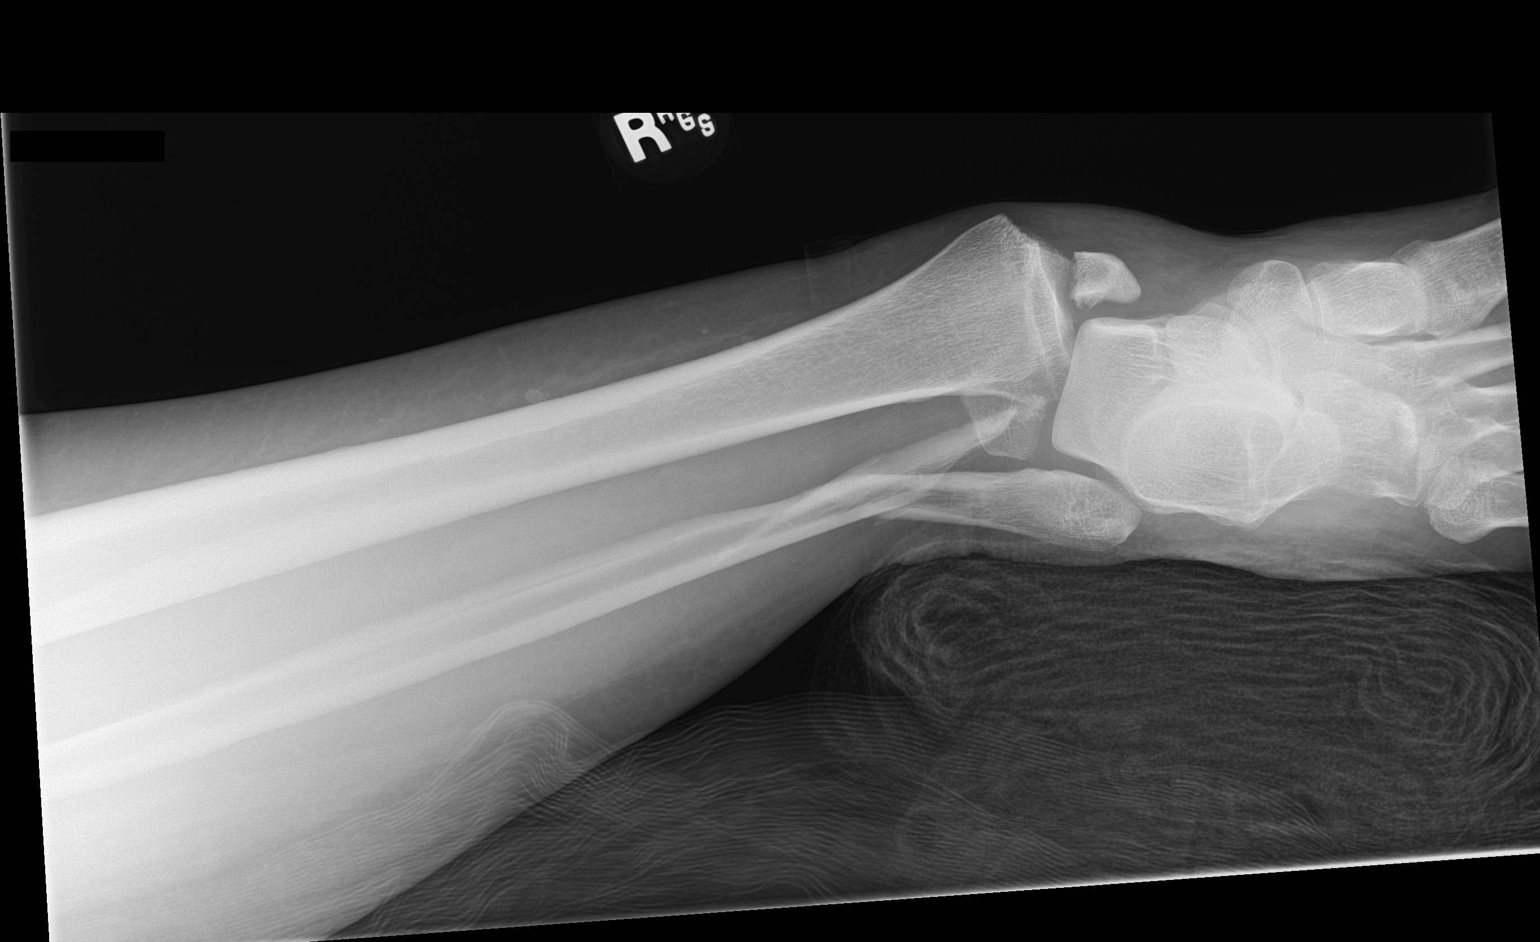

[ankle lat]
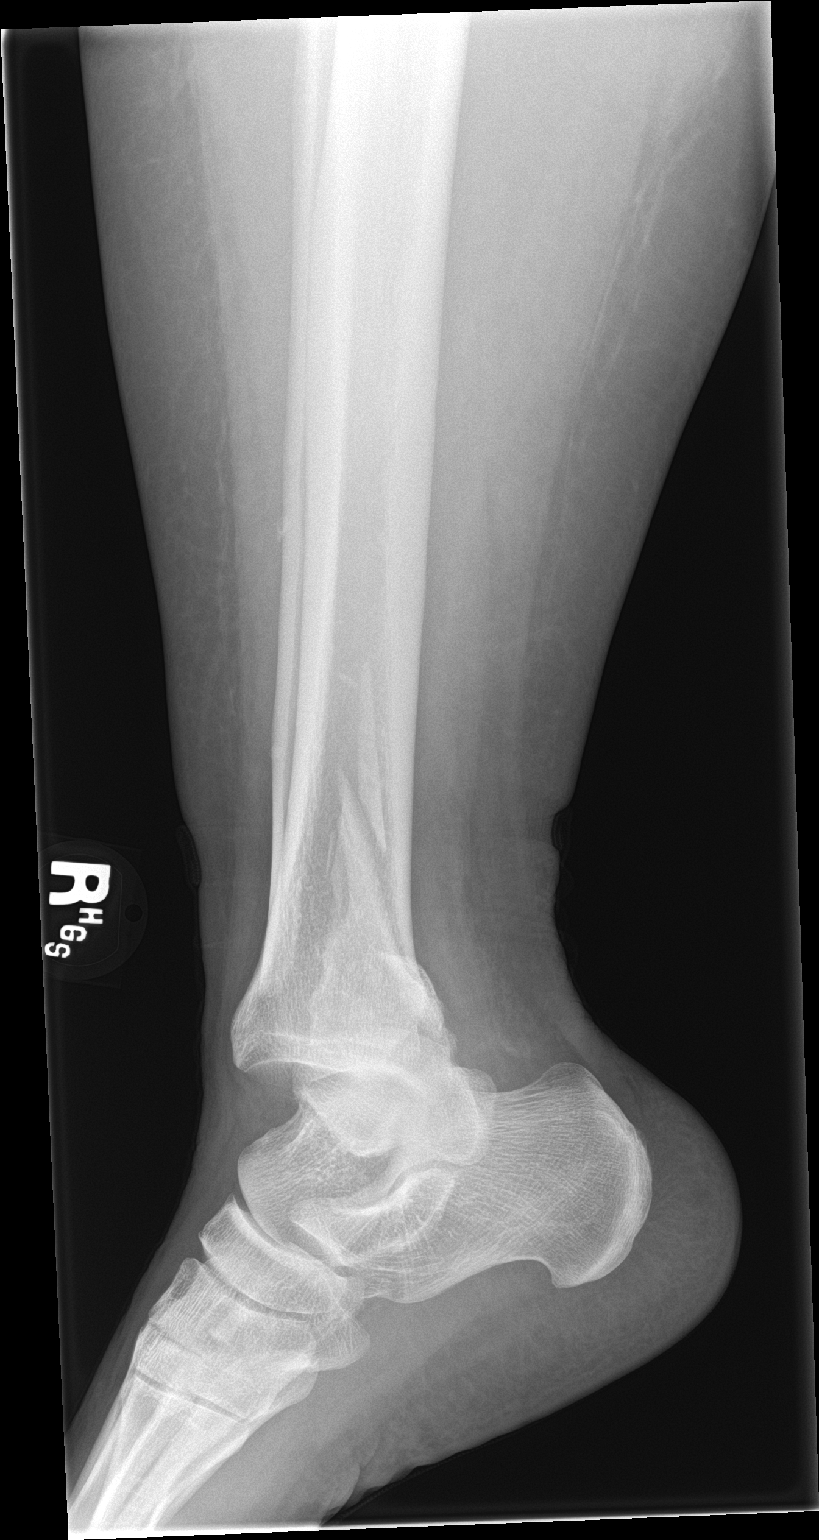

[2 of 2 positions shown; findings below may reference images not displayed]

FINDINGS: There is a transverse fracture through the distal fibula with
lateral angulation of the distal fracture fragment. Additionally
there is fracture through the medial malleolus as well as the
posterior malleolus with lateral dislocation of the talus with
respect to the distal tibia. Some posterior displacement of the
talus with respect to the tibia is also noted.
IMPRESSION: Trimalleolar fracture with dislocation component as described

## 2018-05-31 IMAGING — XA DG C-ARM 61-120 MIN
4 series · 4 of 4 positions shown · non-contrast
Comparison: 02/25/2016

CLINICAL DATA: Portable imaging from ORIF of a right ankle
fracture.

EXAM:
DG C-ARM 61-120 MIN; RIGHT ANKLE - 2 VIEW

[Series 1: ortho standard · 1 of 1 slices shown (1 of 4)]
[im 1/1]
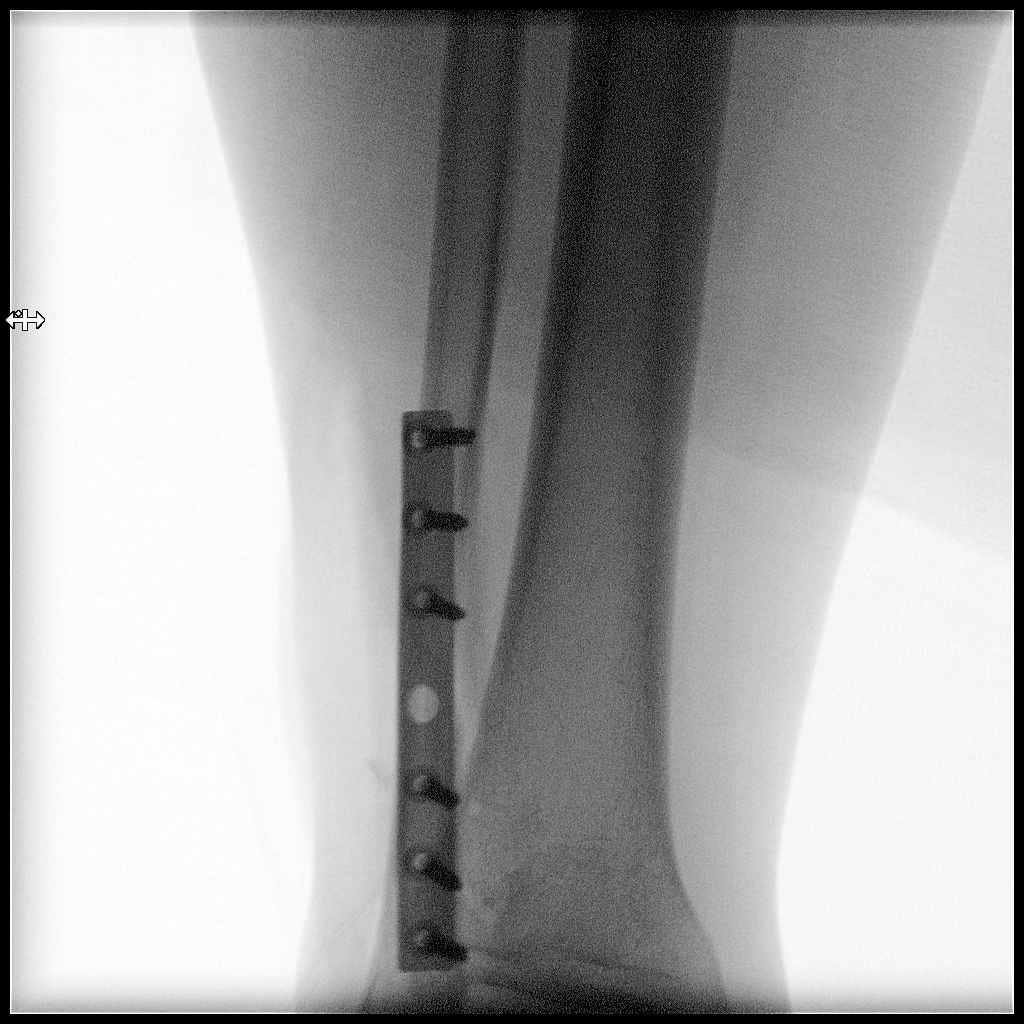

[Series 2: ortho standard · 1 of 1 slices shown (2 of 4)]
[im 1/1]
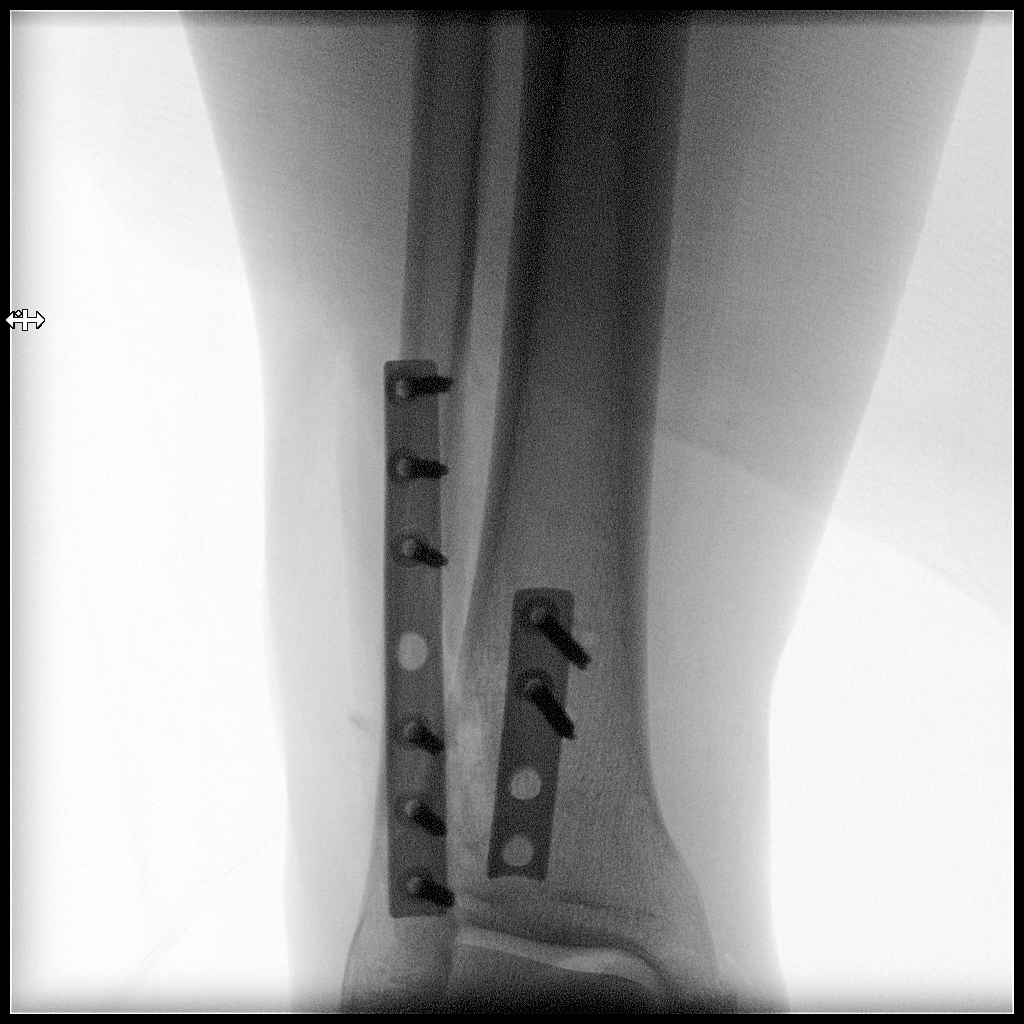

[Series 3: ortho standard · 1 of 1 slices shown (3 of 4)]
[im 1/1]
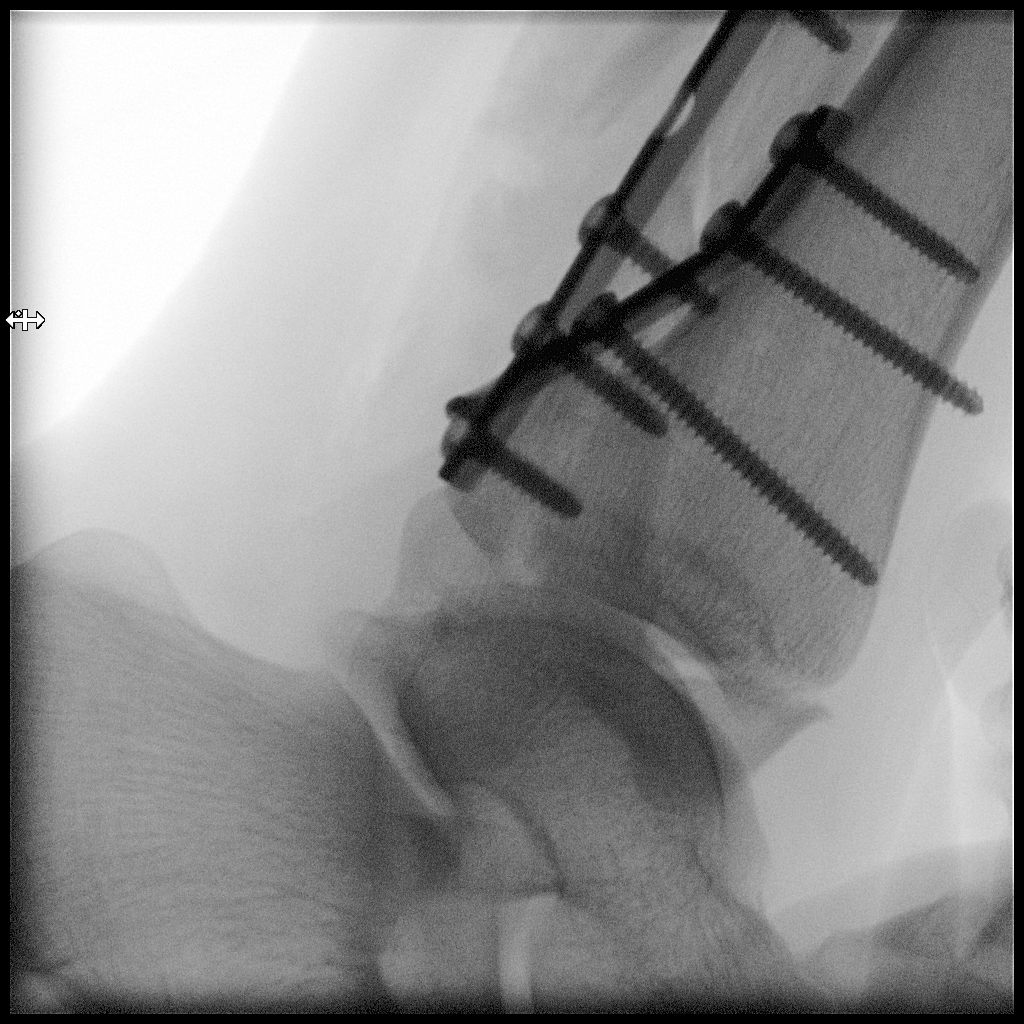

[Series 4: ortho standard · 1 of 1 slices shown (4 of 4)]
[im 1/1]
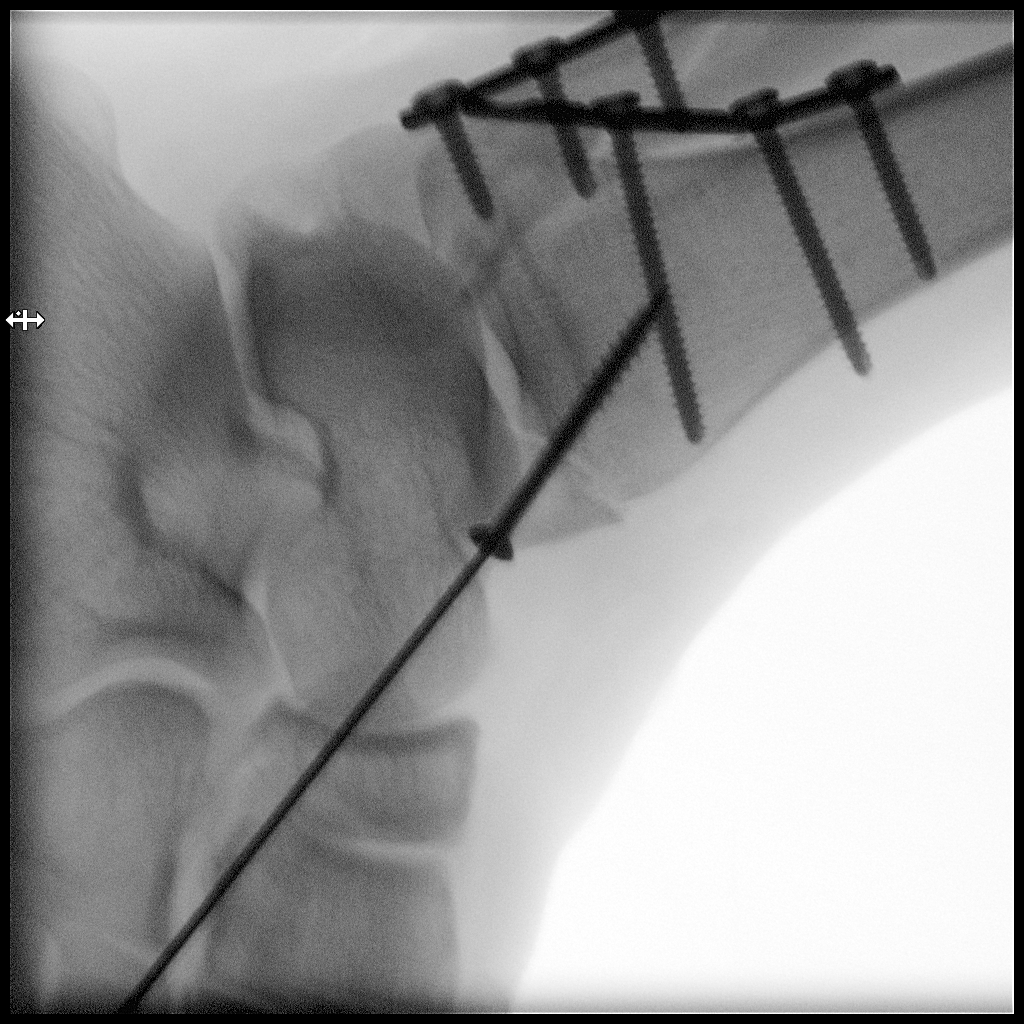

[4 of 4 positions shown; findings below may reference images not displayed]

FINDINGS: Four spot fluoro graphic images show placement of a lateral fixation
plate across the distal fibula with another fixation plate across
the posterior distal tibia, reducing the fibular and posterior
malleolar fractures into near anatomic alignment. The orthopedic
hardware appears well seated. The ankle mortise is normally aligned.
IMPRESSION: Well-aligned fractures following ORIF.
